# Patient Record
Sex: Female | Born: 2000 | Race: White | Hispanic: No | Marital: Single | Smoking: Never smoker
Health system: Southern US, Community
[De-identification: ages and names within clinical notes are randomized; demographics above are authoritative.]

---

## 2019-03-31 ENCOUNTER — Other Ambulatory Visit: Payer: Self-pay

## 2019-03-31 DIAGNOSIS — Z20822 Contact with and (suspected) exposure to covid-19: Secondary | ICD-10-CM

## 2019-04-02 LAB — NOVEL CORONAVIRUS, NAA: SARS-CoV-2, NAA: NOT DETECTED

## 2019-08-19 ENCOUNTER — Emergency Department
Admission: EM | Admit: 2019-08-19 | Discharge: 2019-08-19 | Disposition: A | Discharge status: Home / Self Care | Payer: 59 | Attending: Emergency Medicine | Admitting: Emergency Medicine

## 2019-08-19 ENCOUNTER — Emergency Department: Payer: 59

## 2019-08-19 ENCOUNTER — Other Ambulatory Visit: Payer: Self-pay

## 2019-08-19 DIAGNOSIS — R0789 Other chest pain: Secondary | ICD-10-CM | POA: Diagnosis present

## 2019-08-19 DIAGNOSIS — M25512 Pain in left shoulder: Secondary | ICD-10-CM | POA: Diagnosis not present

## 2019-08-19 LAB — CBC
HCT: 34.9 % — ABNORMAL LOW (ref 36.0–46.0)
Hemoglobin: 11.2 g/dL — ABNORMAL LOW (ref 12.0–15.0)
MCH: 26.7 pg (ref 26.0–34.0)
MCHC: 32.1 g/dL (ref 30.0–36.0)
MCV: 83.1 fL (ref 80.0–100.0)
Platelets: 354 10*3/uL (ref 150–400)
RBC: 4.2 MIL/uL (ref 3.87–5.11)
RDW: 13.3 % (ref 11.5–15.5)
WBC: 6 10*3/uL (ref 4.0–10.5)
nRBC: 0 % (ref 0.0–0.2)

## 2019-08-19 LAB — BASIC METABOLIC PANEL
Anion gap: 9 (ref 5–15)
BUN: 11 mg/dL (ref 6–20)
CO2: 21 mmol/L — ABNORMAL LOW (ref 22–32)
Calcium: 9.1 mg/dL (ref 8.9–10.3)
Chloride: 108 mmol/L (ref 98–111)
Creatinine, Ser: 0.51 mg/dL (ref 0.44–1.00)
GFR calc Af Amer: >60 mL/min (ref >60)
GFR calc non Af Amer: >60 mL/min (ref >60)
Glucose, Bld: 97 mg/dL (ref 70–99)
Potassium: 3.7 mmol/L (ref 3.5–5.1)
Sodium: 138 mmol/L (ref 135–145)

## 2019-08-19 LAB — TROPONIN I (HIGH SENSITIVITY): Troponin I (High Sensitivity): <2 ng/L (ref <18)

## 2019-08-19 LAB — POCT PREGNANCY, URINE: Preg Test, Ur: NEGATIVE

## 2019-08-19 LAB — FIBRIN DERIVATIVES D-DIMER (ARMC ONLY): Fibrin derivatives D-dimer (ARMC): 153.09 ng/mL (FEU) (ref 0.00–499.00)

## 2019-08-19 NOTE — ED Notes (Signed)
Blue top sent to lab in case ordered later d/t pt being on [facility] and c/o of SOB during triage.

## 2019-08-19 NOTE — ED Notes (Signed)
Lab called to add on d-dimer spoke with [facility]. Per Dr. Lenard Lance, ok to cancel repeat troponin.

## 2019-08-19 NOTE — ED Notes (Signed)
Pt presents to the ED for L sided CP that started this AM around 1100. Pt states it started in her L shoulder. Pt states she feel SOB. Pt is NSR on the monitor. On assessment, pt is A&Ox4 and NAD at this time.

## 2019-08-19 NOTE — ED Provider Notes (Signed)
[facility] Emergency Department Provider Note  Time seen: 3:49 PM  I have reviewed the triage vital signs and the nursing notes.   HISTORY  Chief Complaint Chest Pain   HPI Teresa Jacobs is a 19 y.o. female with no past medical history presents to the emergency department for left chest pain.  According to the patient since around 11:00 this morning she has been experiencing pain in her left shoulder that then migrated down into her left chest.  Describes the pain as mild but moderate with deep inspiration or laughing.  Denies any recent cough.  No shortness of breath.  No fever.  No history of chest pain previously.  States she has been under increased stress.  She is on birth control.   History reviewed. No pertinent past medical history.  There are no problems to display for this patient.   History reviewed. No pertinent surgical history.  Prior to Admission medications   Not on File    No Known Allergies  History reviewed. No pertinent family history.  Social History Social History   Tobacco Use  . Smoking status: Never Smoker  Substance Use Topics  . Alcohol use: Yes    Comment: occassion  . Drug use: Not on file    Review of Systems Constitutional: Negative for fever. Cardiovascular: Left chest pain worse with deep inspiration. Respiratory: Negative for shortness of breath. Gastrointestinal: Negative for abdominal pain Musculoskeletal: Negative for musculoskeletal complaints Neurological: Negative for headache All other ROS negative  ____________________________________________   PHYSICAL EXAM:  VITAL SIGNS: ED Triage Vitals  Enc Vitals Group     BP 08/19/19 1315 129/78     Pulse Rate 08/19/19 1315 82     Resp 08/19/19 1315 18     Temp 08/19/19 1315 98.4 F (36.9 C)     Temp Source 08/19/19 1315 Oral     SpO2 08/19/19 1315 100 %     Weight 08/19/19 1316 135 lb (61.2 kg)     Height 08/19/19 1316 5\' 3"  (1.6 m)      Head Circumference --      Peak Flow --      Pain Score 08/19/19 1315 5     Pain Loc --      Pain Edu? --      Excl. in [place]? --    Constitutional: Alert and oriented. Well appearing and in no distress. Eyes: Normal exam ENT      Head: Normocephalic and atraumatic.      Mouth/Throat: Mucous membranes are moist. Cardiovascular: Normal rate, regular rhythm. No murmur Respiratory: Normal respiratory effort without tachypnea nor retractions. Breath sounds are clear.  No chest wall tenderness to palpation or compression.  No chest wall tenderness to upper extremity range of motion. Gastrointestinal: Soft and nontender. No distention.   Musculoskeletal: Nontender with normal range of motion in all extremities. No lower extremity tenderness or edema. Neurologic:  Normal speech and language. No gross focal neurologic deficits Skin:  Skin is warm, dry and intact.  Psychiatric: Mood and affect are normal.   ____________________________________________    EKG  EKG viewed and interpreted by myself shows a normal sinus rhythm at 74 bpm with a narrow QRS, normal axis, normal intervals, no concerning ST changes.  ____________________________________________    RADIOLOGY  X-rays negative  ____________________________________________   INITIAL IMPRESSION / ASSESSMENT AND PLAN / ED COURSE  Pertinent labs & imaging results that were available during my care of the patient were reviewed  by me and considered in my medical decision making (see chart for details).   Patient presents to the emergency department for left chest pain, worse with deep inspiration has been ongoing since 11 AM.  Patient's work-up is thus far reassuring including normal vitals with a normal pulse rate normal O2 saturation.  Patient's labs including cardiac enzymes are negative.  However given the patient's complaint of pleuritic pain being on birth control we will obtain a D-dimer as a precaution.  If the patient's  D-dimer is negative I believe the patient could be safely discharged home with supportive care for likely chest wall discomfort.  If positive we will proceed with CT imaging.  Patient agreeable to plan of care.  Patient's work-up is reassuring including a negative D-dimer.  Patient continues to appear well.  Highly suspect chest wall discomfort.  We will discharge the patient home with PCP follow-up.  Supportive care at home.  Patient agreeable to plan of care.  Discussed my normal chest pain return precautions.  Teresa Jacobs was evaluated in Emergency Department on 08/19/2019 for the symptoms described in the history of present illness. She was evaluated in the context of the global COVID-19 pandemic, which necessitated consideration that the patient might be at risk for infection with the SARS-CoV-2 virus that causes COVID-19. Institutional protocols and algorithms that pertain to the evaluation of patients at risk for COVID-19 are in a state of rapid change based on information released by regulatory bodies including the CDC and federal and state organizations. These policies and algorithms were followed during the patient's care in the ED.  ____________________________________________   FINAL CLINICAL IMPRESSION(S) / ED DIAGNOSES  Left chest pain   Minna Antis, MD 08/19/19 1645

## 2019-08-19 NOTE — ED Triage Notes (Signed)
Pt [facility] with L sided CP that began today. Pain began in L shoulder. Had negative COVID tests last week d/t cough. Denies extra stress today. Wasn't doing any physical activity when pain began. A&O.

## 2021-01-13 IMAGING — CR DG CHEST 2V
1 series · 2 of 2 positions shown · non-contrast
Comparison: None.

CLINICAL DATA: Non smoker, anterior chest pain since 11am today. No
previous chest pain. No hx of heart or lung disease. No fever, no

EXAM:
CHEST - 2 VIEW

[Series 1: dg chest 2 view · 0.14mm/px · 2 of 2 slices shown]
[im 1/2]
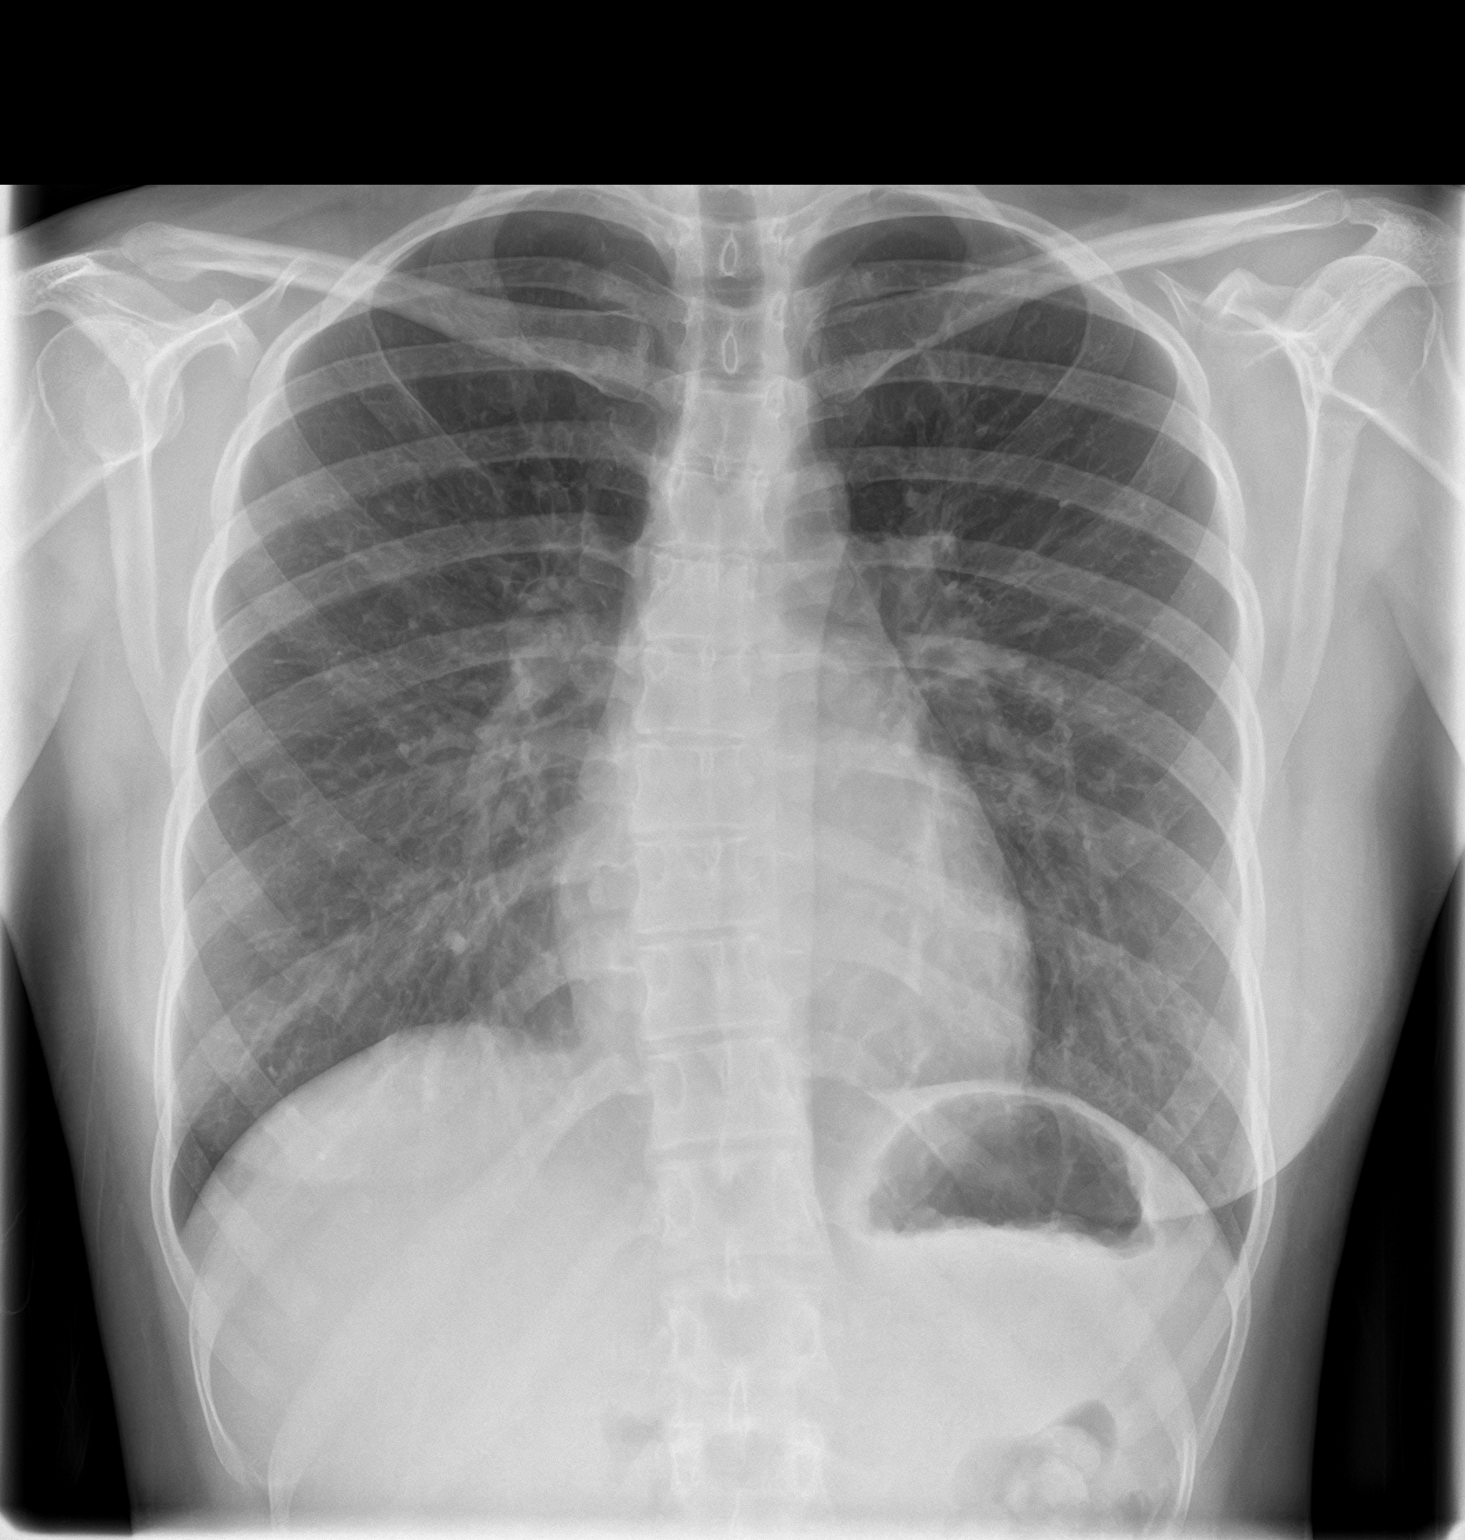
[im 2/2]
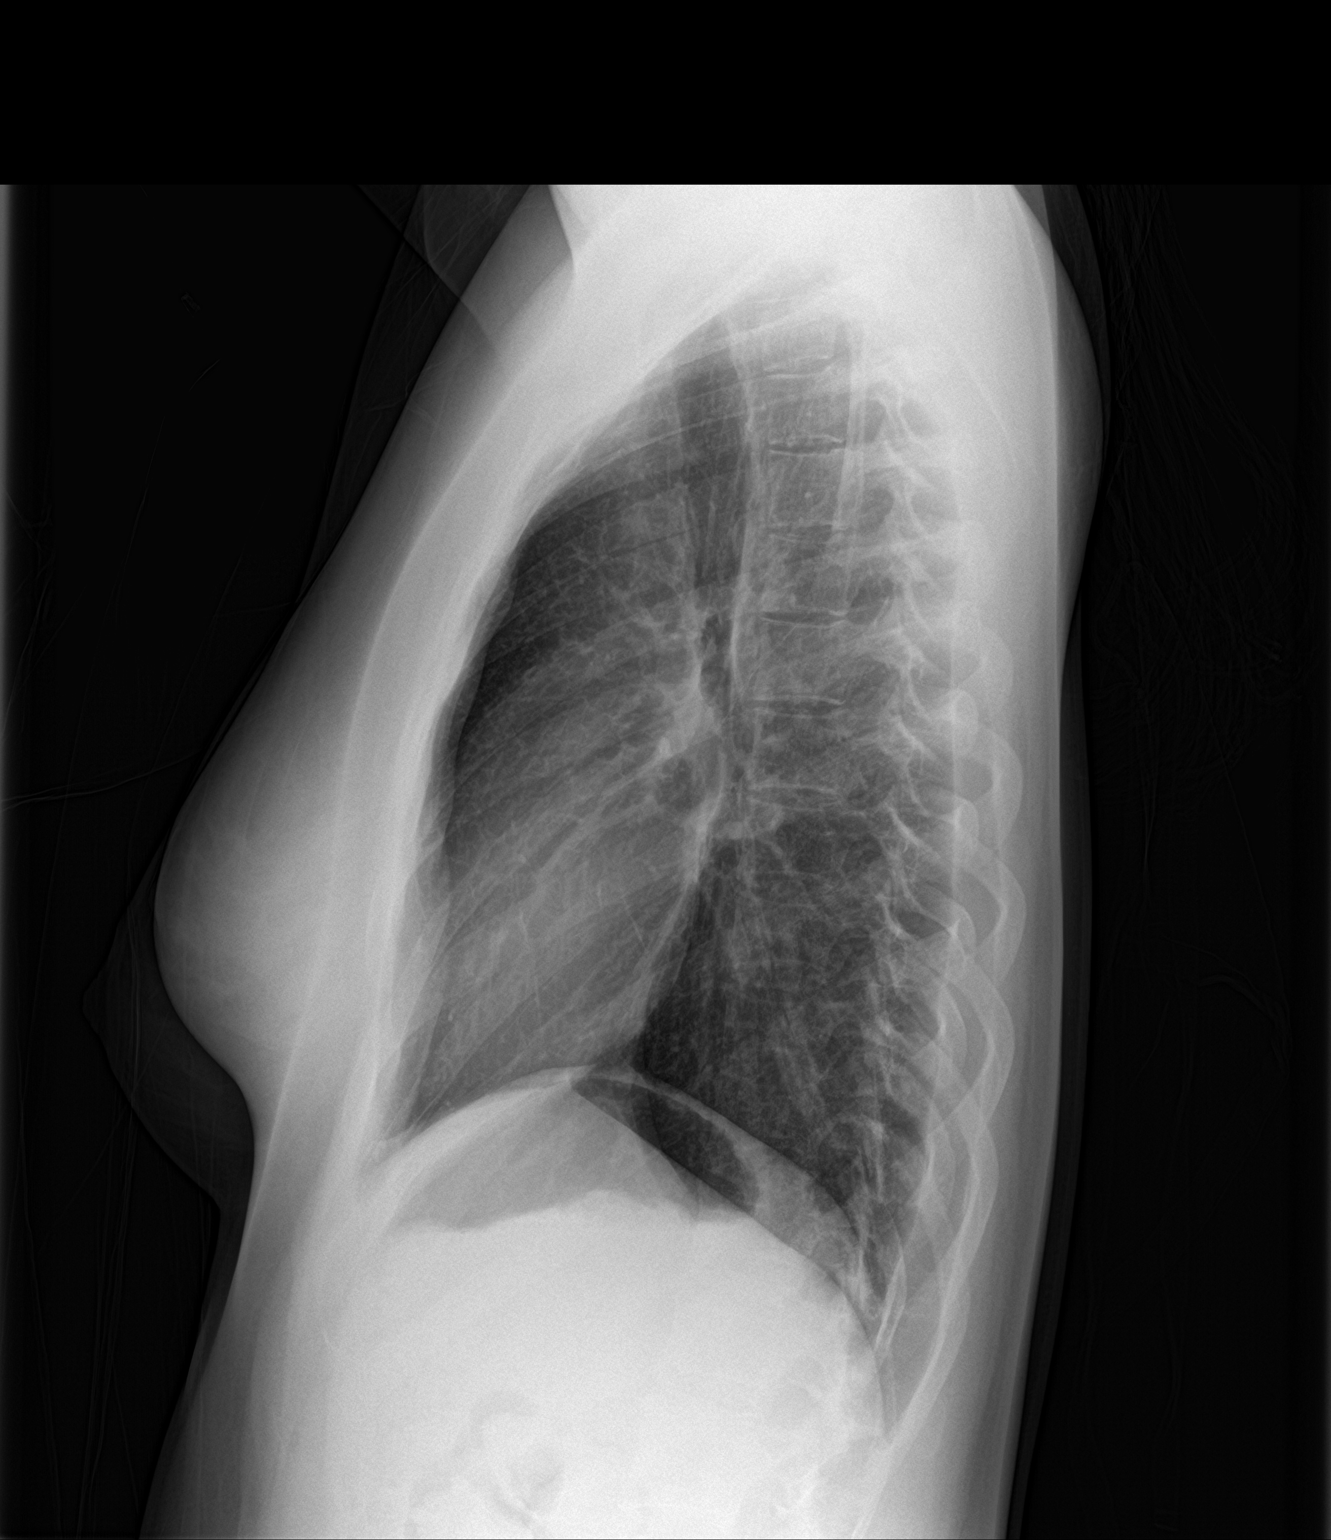

[2 of 2 positions shown; findings below may reference images not displayed]

FINDINGS: Cardiac silhouette is normal in size. No mediastinal or hilar masses
or evidence of adenopathy.

Clear lungs.  No pleural effusion or pneumothorax.

Skeletal structures are intact.
IMPRESSION: No active cardiopulmonary disease.

## 2022-02-10 ENCOUNTER — Ambulatory Visit (INDEPENDENT_AMBULATORY_CARE_PROVIDER_SITE_OTHER): Payer: 59 | Admitting: Adult Health

## 2022-02-10 ENCOUNTER — Other Ambulatory Visit: Payer: Self-pay

## 2022-02-10 ENCOUNTER — Encounter: Payer: Self-pay | Admitting: Adult Health

## 2022-02-10 VITALS — BP 112/78 | HR 99 | Temp 98.1°F | Resp 18 | Ht 64.0 in | Wt 144.0 lb

## 2022-02-10 DIAGNOSIS — L03011 Cellulitis of right finger: Secondary | ICD-10-CM | POA: Diagnosis not present

## 2022-02-10 MED ORDER — MUPIROCIN 2 % EX OINT: 1.0000 | TOPICAL_OINTMENT | Freq: Three times a day (TID) | CUTANEOUS | 0 refills | Status: DC

## 2022-02-10 NOTE — Progress Notes (Signed)
[facility] [address] [place], [place] [address] Phone: [phone] Fax: [phone]   Office Visit Note  Patient Name: Teresa Jacobs  Date of Birth:21-Apr-2001  Med Rec number [id number]  Date of Service: 02/10/2022  Patient has no known allergies.  Chief Complaint  Patient presents with   Other     HPI  Patient reports ring finger, right hand has enlarged, red area at base of nail.  It is very tender.    Current Medication:  Outpatient Encounter Medications as of 02/10/2022  Medication Sig   mupirocin ointment (BACTROBAN) 2 % Apply 1 Application topically 3 (three) times daily.   CRYSELLE-28 0.3-30 MG-MCG tablet Take 1 tablet by mouth daily. (Patient not taking: Reported on 02/10/2022)   No facility-administered encounter medications on file as of 02/10/2022.      Medical History: History reviewed. No pertinent past medical history.   Vital Signs: BP 112/78   Pulse 99   Temp 98.1 F (36.7 C) (Tympanic)   Resp 18   Ht 5\' 4"  (1.626 m)   Wt 144 lb (65.3 kg)   SpO2 99%   BMI 24.72 kg/m    Review of Systems  Constitutional:  Negative for fatigue and fever.  Skin:        Red swollen area at base of finger nail, ring finger right hand.    Physical Exam Constitutional:      Appearance: Normal appearance.  Skin:    Comments: 4th digit, right hand, paronychia present at base of nail. No draining.   Neurological:     Mental Status: She is alert.    Assessment/Plan: 1. Paronychia of finger, right Epsom salt soaks 2-3 times daily, and use bactroban as prescribed.  Follow up via MyChart messenger if symptoms fail to improve or may return to clinic as needed for worsening symptoms.   - mupirocin ointment (BACTROBAN) 2 %; Apply 1 Application topically 3 (three) times daily.  Dispense: 22 g; Refill: 0        General Counseling: Teresa Jacobs verbalizes understanding of the findings of todays visit and agrees with plan of treatment. I have  discussed any further diagnostic evaluation that may be needed or ordered today. We also reviewed her medications today. she has been encouraged to call the office with any questions or concerns that should arise related to todays visit.   No orders of the defined types were placed in this encounter.   Meds ordered this encounter  Medications   mupirocin ointment (BACTROBAN) 2 %    Sig: Apply 1 Application topically 3 (three) times daily.    Dispense:  22 g    Refill:  0    Time spent:20 Minutes    AGNP-C Nurse Practitioner

## 2022-04-07 ENCOUNTER — Encounter: Payer: Self-pay | Admitting: Oncology

## 2022-04-07 ENCOUNTER — Ambulatory Visit (INDEPENDENT_AMBULATORY_CARE_PROVIDER_SITE_OTHER): Payer: 59 | Admitting: Oncology

## 2022-04-07 VITALS — HR 84 | Temp 97.6°F

## 2022-04-07 DIAGNOSIS — J029 Acute pharyngitis, unspecified: Secondary | ICD-10-CM

## 2022-04-07 MED ORDER — AMOXICILLIN-POT CLAVULANATE 875-125 MG PO TABS: 1.0000 | ORAL_TABLET | Freq: Two times a day (BID) | ORAL | 0 refills | Status: DC

## 2022-04-07 NOTE — Progress Notes (Signed)
[facility] [address] [place], [place] [address] Phone: [phone] Fax: [phone]   Office Visit Note  Patient Name: Teresa Jacobs  Date of Birth:Jul 03, 2000  Med Rec number [id number]  Date of Service: 04/07/2022  Patient has no known allergies.  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 Has had tonsillitis in the past and last treated about a year ago.  States usually her tonsils are completely white but today they just look super red with some scattered white spots.  Symptoms feel similar to previous. Knows several people who also have similar symptoms. No fevers. Has taken dayquil/nyquil yesterday and today.  No nausea vomiting or diarrhea.  No history of mono.  Having trouble sleeping due to pain.  No drug allergies.   Current Medication:  Outpatient Encounter Medications as of 04/07/2022  Medication Sig   CRYSELLE-28 0.3-30 MG-MCG tablet Take 1 tablet by mouth daily. (Patient not taking: Reported on 02/10/2022)   mupirocin ointment (BACTROBAN) 2 % Apply 1 Application topically 3 (three) times daily.   No facility-administered encounter medications on file as of 04/07/2022.    Medical History: History reviewed. No pertinent past medical history.   Vital Signs: Pulse 84   Temp 97.6 F (36.4 C) (Tympanic)   SpO2 100%   ROS: As per HPI.  All other pertinent ROS negative.     Review of Systems  Constitutional:  Positive for chills, diaphoresis and fatigue.  HENT:  Positive for congestion and sore throat.   Neurological:  Negative for headaches.    Physical Exam Vitals reviewed.  Constitutional:      Appearance: She is well-developed.  HENT:     Right Ear: Tympanic membrane normal.     Left Ear: Tympanic membrane normal.     Nose: Nasal tenderness and congestion present.      Right Turbinates: Swollen.     Left Turbinates: Swollen.     Right Sinus: No maxillary sinus tenderness or frontal sinus tenderness.     Left Sinus: No maxillary sinus tenderness or frontal sinus tenderness.     Mouth/Throat:     Mouth: Mucous membranes are moist.     Pharynx: Pharyngeal swelling and posterior oropharyngeal erythema present.     Tonsils: Tonsillar exudate present. 1+ on the right. 3+ on the left.  Lymphadenopathy:     Head:     Right side of head: Tonsillar adenopathy present.     Left side of head: Tonsillar adenopathy present.     Cervical: Cervical adenopathy present.     Right cervical: Superficial cervical adenopathy present.     Left cervical: Superficial cervical adenopathy present.  Neurological:     Mental Status: She is alert.    No results found for this or any previous visit (from the past 24 hour(s)).  Assessment/Plan: 1. Sore throat - Declined strep testing.  Given duration of illness, previous tonsillitis infections and exam would recommend Augmentin twice daily x10 days.  Discussed continuing over-the-counter medications such as DayQuil/NyQuil for nasal congestion and postnasal drainage.  Can add Flonase 2 sprays each nostril daily.  Continue Tylenol or ibuprofen for fever.  - amoxicillin-clavulanate (AUGMENTIN) 875-125 MG tablet; Take 1 tablet by mouth 2 (two) times daily.  Dispense: 20 tablet; Refill: 0   Disposition-return to clinic for worsening symptoms or  failure to improve.  General Counseling: Teresa Jacobs verbalizes understanding of the findings of todays visit and agrees with plan of treatment. I have discussed any further diagnostic evaluation that may be needed or ordered today. We also reviewed her medications today. she has been encouraged to call the office with any questions or concerns that should arise related to todays visit.   No orders of the defined types were placed in this encounter.   No orders of the defined types were placed in  this encounter.   I spent 20 minutes dedicated to the care of this patient (face-to-face and non-face-to-face) on the date of the encounter to include what is described in the assessment and plan.   Faythe Casa, NP 04/07/2022 12:57 PM

## 2022-07-01 ENCOUNTER — Encounter: Payer: Self-pay | Admitting: Medical

## 2022-07-01 ENCOUNTER — Ambulatory Visit (INDEPENDENT_AMBULATORY_CARE_PROVIDER_SITE_OTHER): Payer: 59 | Admitting: Medical

## 2022-07-01 ENCOUNTER — Other Ambulatory Visit: Payer: Self-pay

## 2022-07-01 VITALS — BP 99/61 | HR 90 | Temp 98.6°F | Ht 64.02 in | Wt 139.0 lb

## 2022-07-01 DIAGNOSIS — Z3202 Encounter for pregnancy test, result negative: Secondary | ICD-10-CM

## 2022-07-01 DIAGNOSIS — Z113 Encounter for screening for infections with a predominantly sexual mode of transmission: Secondary | ICD-10-CM | POA: Diagnosis not present

## 2022-07-01 DIAGNOSIS — R112 Nausea with vomiting, unspecified: Secondary | ICD-10-CM | POA: Diagnosis not present

## 2022-07-01 LAB — POCT URINE PREGNANCY: Preg Test, Ur: NEGATIVE

## 2022-07-01 MED ORDER — ONDANSETRON 4 MG PO TBDP: 4.0000 mg | ORAL_TABLET | Freq: Three times a day (TID) | ORAL | 0 refills | Status: AC | PRN

## 2022-07-01 NOTE — Progress Notes (Signed)
[address] [place], [place] [address] Phone: [phone] Fax: [phone]   Office Visit Note  Patient Name: Teresa Jacobs  Date of DTOIZ:[id number]  Med Rec number [id number]  Date of Service: 07/01/2022  Allergies: Patient has no known allergies.  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  Also requests STI screening and urine pregnancy test.  Was abroad in [place], [place] and [place] for [place].   Had 2 bouts of GI illness while abroad.  First illness was about 2.5 weeks ago, had diarrhea, fever (thinks 101) and muscle stiffness. Did not have nausea/vomiting. Had sx for 2-3 days. Saw doctor in [place], given an antibiotic for 5 days. Sx began to improve rapidly with antibiotic. Sx initially began while in [place].  Was well for about 6 days. 8d ago, while in [place], developed nausea and frequent vomiting. Had very little appetite. Saw another doctor, got IV fluids and meds for sx relief. No fever, but was fatigued. Vomiting lasted 1 day, fatigue and sensitive stomach for a couple more days.  Noted nausea again last night after arriving back home, after about 24 hrs of travel. Woke up around 2 pm today due to jet-lag, was nauseated, vomited several times. No chills or fever. Little cramping just before vomiting. No urinary symptoms. Back feels achy/crampy.  Would also like STI testing and pregnancy test. Not on birth control. Had unprotected, consensual vaginal intercourse 2 weeks ago. [place] purchased in [place] within 24 hours, was at end of menses. Had bleeding for few extra days.   Others on trip also experienced similar sx for both illnesses.   Current Medication:  Outpatient Encounter Medications as of 07/01/2022  Medication Sig   [DISCONTINUED] amoxicillin-clavulanate (AUGMENTIN)  875-125 MG tablet Take 1 tablet by mouth 2 (two) times daily. (Patient not taking: Reported on 07/01/2022)   [DISCONTINUED] CRYSELLE-28 0.3-30 MG-MCG tablet Take 1 tablet by mouth daily. (Patient not taking: Reported on 02/10/2022)   [DISCONTINUED] mupirocin ointment (BACTROBAN) 2 % Apply 1 Application topically 3 (three) times daily. (Patient not taking: Reported on 07/01/2022)   No facility-administered encounter medications on file as of 07/01/2022.      Medical History: History reviewed. No pertinent past medical history.   Vital Signs: BP 99/61   Pulse 90   Temp 98.6 F (37 C) (Tympanic)   Ht 5' 4.02" (1.626 m)   Wt 139 lb (63 kg)   SpO2 99%   BMI 23.85 kg/m    Review of Systems  Constitutional:  Positive for fatigue. Negative for chills and fever.  Gastrointestinal:  Positive for abdominal pain (cramping before vomiting), nausea and vomiting. Negative for diarrhea.  Genitourinary:  Negative for dysuria, frequency, hematuria and urgency.  Musculoskeletal:  Positive for back pain.    Physical Exam Vitals reviewed.  Constitutional:      General: She is not in acute distress.    Appearance: She is not ill-appearing.  HENT:     Mouth/Throat:     Mouth: Mucous membranes are moist.     Pharynx: Oropharynx is clear. No pharyngeal swelling or posterior oropharyngeal erythema.  Cardiovascular:     Rate and Rhythm: Normal rate and regular rhythm.     Heart sounds: No murmur heard.    No friction rub. No gallop.  Pulmonary:  Effort: Pulmonary effort is normal.     Breath sounds: Normal breath sounds. No wheezing, rhonchi or rales.  Abdominal:     General: Abdomen is flat. Bowel sounds are normal. There is no distension.     Tenderness: There is no abdominal tenderness. There is no right CVA tenderness or left CVA tenderness.  Musculoskeletal:     Cervical back: Neck supple. No rigidity.  Lymphadenopathy:     Cervical: No cervical adenopathy.  Neurological:     Mental  Status: She is alert.      POCT urine pregnancy     Status: Normal   Collection Time: 07/01/22  5:08 PM  Result Value Ref Range   Preg Test, Ur Negative Negative    Assessment/Plan: 1. Nausea and vomiting, unspecified vomiting type Possible that nausea/vomiting in last 24 is related to recent illness while abroad. Also possibly related to fatigue/jet-lag. Exam is benign, vital signs reassuring. Will give Zofran as needed for nausea or vomiting. Discussed supportive and symptomatic treatment.  - POCT urine pregnancy - ondansetron (ZOFRAN-ODT) 4 MG disintegrating tablet; Take 1 tablet (4 mg total) by mouth every 8 (eight) hours as needed for nausea or vomiting.  Dispense: 15 tablet; Refill: 0  2. Screening examination for STD (sexually transmitted disease) Urine sample sent for screening. Patient will receive results via MyChart. - Chlamydia/Gonococcus/Trichomonas, NAA  3. Pregnancy test negative Discussed scheduling return appointment to discuss birth control options if desired. Reminded to use condoms consistently if sexually active to prevent pregnancy and avoid STI.  - POCT urine pregnancy     General Counseling: Tommy verbalizes understanding of the findings of todays visit and agrees with plan of treatment. she has been encouraged to call the office with any questions or concerns that should arise related to todays visit.   Time spent:20 [place] PA-C [place] 07/01/2022 4:32 PM

## 2022-07-03 LAB — CHLAMYDIA/GONOCOCCUS/TRICHOMONAS, NAA
Chlamydia by NAA: NEGATIVE
Gonococcus by NAA: NEGATIVE
Trich vag by NAA: NEGATIVE

## 2022-07-04 ENCOUNTER — Encounter: Payer: Self-pay | Admitting: Medical

## 2022-07-04 NOTE — Patient Instructions (Signed)
-  Rest and stay well-hydrated.  Take frequent sips of water, sports drink or other electrolyte beverage (i.e. Pedialyte, Liquid IV).   -Use Ondansetron every 8 hours as needed for nausea or vomiting.  -Eat bland diet initially (banana, rice, applesauce, toast w/jam, oatmeal, crackers). Avoid spicy, fried, oily or acidic (i.e. citrus, tomato, tomato sauce) foods. If you are having diarrhea, avoid dairy products (i.e. milk, butter) until your stool returns to normal.  -You will receive a MyChart message notifying you of your lab results when they are available.   -Consider scheduling an appointment to discussed birth control options. Use condoms every time you have sex to prevent pregnancy and sexually transmitted infections.  -Send MyChart message to provider or schedule return visit as needed for new/worsening symptoms (such as fever, abdominal pain) or if symptoms are not improving over next 2-3 days.
